# Patient Record
Sex: Female | Born: 1950 | Race: Black or African American | Hispanic: No
Health system: Southern US, Community
[De-identification: ages and names within clinical notes are randomized; demographics above are authoritative.]

## PROBLEM LIST (undated history)

## (undated) DIAGNOSIS — I10 Essential (primary) hypertension: Secondary | ICD-10-CM

## (undated) DIAGNOSIS — E119 Type 2 diabetes mellitus without complications: Secondary | ICD-10-CM

---

## 2019-08-28 ENCOUNTER — Emergency Department
Admission: EM | Admit: 2019-08-28 | Discharge: 2019-08-28 | Disposition: A | Discharge status: Home / Self Care | Payer: Medicare Other | Attending: Student in an Organized Health Care Education/Training Program | Admitting: Student in an Organized Health Care Education/Training Program

## 2019-08-28 ENCOUNTER — Emergency Department: Payer: Medicare Other

## 2019-08-28 ENCOUNTER — Other Ambulatory Visit: Payer: Self-pay

## 2019-08-28 DIAGNOSIS — W010XXA Fall on same level from slipping, tripping and stumbling without subsequent striking against object, initial encounter: Secondary | ICD-10-CM | POA: Diagnosis not present

## 2019-08-28 DIAGNOSIS — Y999 Unspecified external cause status: Secondary | ICD-10-CM | POA: Insufficient documentation

## 2019-08-28 DIAGNOSIS — I10 Essential (primary) hypertension: Secondary | ICD-10-CM | POA: Diagnosis not present

## 2019-08-28 DIAGNOSIS — W19XXXA Unspecified fall, initial encounter: Secondary | ICD-10-CM

## 2019-08-28 DIAGNOSIS — S0990XA Unspecified injury of head, initial encounter: Secondary | ICD-10-CM | POA: Diagnosis present

## 2019-08-28 DIAGNOSIS — E119 Type 2 diabetes mellitus without complications: Secondary | ICD-10-CM | POA: Insufficient documentation

## 2019-08-28 DIAGNOSIS — Y9301 Activity, walking, marching and hiking: Secondary | ICD-10-CM | POA: Diagnosis not present

## 2019-08-28 DIAGNOSIS — Y92512 Supermarket, store or market as the place of occurrence of the external cause: Secondary | ICD-10-CM | POA: Diagnosis not present

## 2019-08-28 HISTORY — DX: Essential (primary) hypertension: I10

## 2019-08-28 HISTORY — DX: Type 2 diabetes mellitus without complications: E11.9

## 2019-08-28 LAB — CBC WITH DIFFERENTIAL/PLATELET
Abs Immature Granulocytes: 0.03 10*3/uL (ref 0.00–0.07)
Basophils Absolute: 0 10*3/uL (ref 0.0–0.1)
Basophils Relative: 0 %
Eosinophils Absolute: 0.3 10*3/uL (ref 0.0–0.5)
Eosinophils Relative: 4 %
HCT: 33.5 % — ABNORMAL LOW (ref 36.0–46.0)
Hemoglobin: 10.4 g/dL — ABNORMAL LOW (ref 12.0–15.0)
Immature Granulocytes: 0 %
Lymphocytes Relative: 36 %
Lymphs Abs: 2.5 10*3/uL (ref 0.7–4.0)
MCH: 26.1 pg (ref 26.0–34.0)
MCHC: 31 g/dL (ref 30.0–36.0)
MCV: 84 fL (ref 80.0–100.0)
Monocytes Absolute: 0.6 10*3/uL (ref 0.1–1.0)
Monocytes Relative: 9 %
Neutro Abs: 3.4 10*3/uL (ref 1.7–7.7)
Neutrophils Relative %: 51 %
Platelets: 222 10*3/uL (ref 150–400)
RBC: 3.99 MIL/uL (ref 3.87–5.11)
RDW: 15.6 % — ABNORMAL HIGH (ref 11.5–15.5)
WBC: 6.8 10*3/uL (ref 4.0–10.5)
nRBC: 0 % (ref 0.0–0.2)

## 2019-08-28 LAB — COMPREHENSIVE METABOLIC PANEL
ALT: 22 U/L (ref 0–44)
AST: 16 U/L (ref 15–41)
Albumin: 3.7 g/dL (ref 3.5–5.0)
Alkaline Phosphatase: 107 U/L (ref 38–126)
Anion gap: 9 (ref 5–15)
BUN: 18 mg/dL (ref 8–23)
CO2: 28 mmol/L (ref 22–32)
Calcium: 8.7 mg/dL — ABNORMAL LOW (ref 8.9–10.3)
Chloride: 102 mmol/L (ref 98–111)
Creatinine, Ser: 0.93 mg/dL (ref 0.44–1.00)
GFR calc Af Amer: >60 mL/min (ref >60)
GFR calc non Af Amer: >60 mL/min (ref >60)
Glucose, Bld: 246 mg/dL — ABNORMAL HIGH (ref 70–99)
Potassium: 4.8 mmol/L (ref 3.5–5.1)
Sodium: 139 mmol/L (ref 135–145)
Total Bilirubin: 0.7 mg/dL (ref 0.3–1.2)
Total Protein: 6.7 g/dL (ref 6.5–8.1)

## 2019-08-28 MED ORDER — BUTALBITAL-APAP-CAFFEINE 50-325-40 MG PO TABS: 1.0000 | ORAL_TABLET | Freq: Four times a day (QID) | ORAL | 0 refills | Status: AC | PRN

## 2019-08-28 MED ORDER — ONDANSETRON HCL 4 MG/2ML IJ SOLN
4.0000 mg | Freq: Once | INTRAMUSCULAR | Status: AC
  Administered 2019-08-28: 4 mg via INTRAVENOUS
  Filled 2019-08-28: qty 2

## 2019-08-28 MED ORDER — SODIUM CHLORIDE 0.9 % IV BOLUS
500.0000 mL | Freq: Once | INTRAVENOUS | Status: AC
  Administered 2019-08-28: 500 mL via INTRAVENOUS

## 2019-08-28 MED ORDER — ACETAMINOPHEN 500 MG PO TABS
1000.0000 mg | ORAL_TABLET | Freq: Once | ORAL | Status: AC
  Administered 2019-08-28: 1000 mg via ORAL
  Filled 2019-08-28: qty 2

## 2019-08-28 MED ORDER — FENTANYL CITRATE (PF) 100 MCG/2ML IJ SOLN
50.0000 ug | INTRAMUSCULAR | Status: DC | PRN
  Administered 2019-08-28: 50 ug via INTRAVENOUS
  Filled 2019-08-28: qty 2

## 2019-08-28 NOTE — ED Triage Notes (Signed)
Pt arrives via [facility] from home after slipping and falling on wet paint in the [employer] parking lot. Per EMS pt did hit her head but unknown if she had LOC. Pt reports pain in the back of her neck and left side. Pt is A&Ox4 but is slow to answer questions. Per pt's daughter pt speaks softly when she is not feeling well. She reports no blurred vision, pt is tearful during triage

## 2019-08-28 NOTE — ED Notes (Signed)
Pt otf for imaging, daughter remains at bedside

## 2019-08-28 NOTE — ED Notes (Signed)
Pt ambulated in hallway with minimal assistance. Pt did require assistance with getting up from the bed but walked with steady but slow gait. Reports pain in the left side and rib area. Encouraged pt to drink fluids

## 2019-08-28 NOTE — Discharge Instructions (Signed)
Please follow up with PCP.   As discussed in the emergency department, you may use Tylenol and/or Ibuprofen for headaches. These are "Over the Counter" medications and can be found at most drug stores and grocery stores. Please use the recommended dosing instructions on the bottle/box. Do not exceed the maximum dose for either medications. Please be sure to rest and drink plenty of fluids. Please be sure to call your PCP for a follow-up visit, especially if your headaches persist.  Please call your physician or return to ED if you have: 1. Worsening or change in headaches. 2. Changes in vision. 3. New-onset nausea and vomiting. 4. Numbness, tingling, weakness in your extremities,. 4. Inability to eat or drink adequate amounts of food or liquids. 5. Chest pain, shortness of breath, or difficulty breathing. 6. Neurological changes- dizziness, fainting, loss of function of your arms, legs or other parts of your body. 7. Uncontrolled hypertension. 8. Or any other emergent concerns.

## 2019-08-28 NOTE — ED Provider Notes (Signed)
[facility] Emergency Department Provider Note    First MD Initiated Contact with Patient 08/28/19 1525     (approximate)  I have reviewed the triage vital signs and the nursing notes.   HISTORY  Chief Complaint Fall    HPI Yolanda Davies is a 68 y.o. female with the below listed history presents to the ER for evaluation of headache neck pain and low back pain after mechanical fall at [facility].  She slipped on white painted concrete in front of the [place] center following hitting her head.  There was LOC for bruit..  Patient complaining of mild to moderate headache.  Denies any numbness or tingling at this time.  Denies any anticoagulation.    Past Medical History:  Diagnosis Date  . Diabetes mellitus without complication ([place])   . Hypertension    History reviewed. No pertinent family history.  There are no active problems to display for this patient.     Prior to Admission medications   Not on File    Allergies Patient has no allergy information on record.    Social History Social History   Tobacco Use  . Smoking status: Not on file  Substance Use Topics  . Alcohol use: Not on file  . Drug use: Not on file    Review of Systems Patient denies headaches, rhinorrhea, blurry vision, numbness, shortness of breath, chest pain, edema, cough, abdominal pain, nausea, vomiting, diarrhea, dysuria, fevers, rashes or hallucinations unless otherwise stated above in HPI. ____________________________________________   PHYSICAL EXAM:  VITAL SIGNS: Vitals:   08/28/19 1516 08/28/19 1635  BP: (!) 177/100 (!) 170/93  Pulse: (!) 115 (!) 107  Resp: 19 18  Temp: 98.4 F (36.9 C)   SpO2: 99% 94%    Constitutional: Alert and oriented.  Eyes: Conjunctivae are normal.  Head: ttp in posterior scalp no laceration Nose: No congestion/rhinnorhea. Mouth/Throat: Mucous membranes are moist.   Neck: No stridor. ttp of midling neck without stepoffs or  deformities.  .  Cardiovascular: Normal rate, regular rhythm. Grossly normal heart sounds.  Good peripheral circulation. Respiratory: Normal respiratory effort.  No retractions. Lungs CTAB. Gastrointestinal: Soft and nontender. No distention. No abdominal bruits. No CVA tenderness. Genitourinary:  Musculoskeletal: No lower extremity tenderness nor edema.  No joint effusions. Neurologic:  Normal speech and language. No gross focal neurologic deficits are appreciated. No facial droop Skin:  Skin is warm, dry and intact. No rash noted. Psychiatric: Mood and affect are normal. Speech and behavior are normal.  ____________________________________________   LABS (all labs ordered are listed, but only abnormal results are displayed)  Results for orders placed or performed during the hospital encounter of 08/28/19 (from the past 24 hour(s))  CBC with Differential/Platelet     Status: Abnormal   Collection Time: 08/28/19  4:13 PM  Result Value Ref Range   WBC 6.8 4.0 - 10.5 K/uL   RBC 3.99 3.87 - 5.11 MIL/uL   Hemoglobin 10.4 (L) 12.0 - 15.0 g/dL   HCT 33.5 (L) 36.0 - 46.0 %   MCV 84.0 80.0 - 100.0 fL   MCH 26.1 26.0 - 34.0 pg   MCHC 31.0 30.0 - 36.0 g/dL   RDW 15.6 (H) 11.5 - 15.5 %   Platelets 222 150 - 400 K/uL   nRBC 0.0 0.0 - 0.2 %   Neutrophils Relative % 51 %   Neutro Abs 3.4 1.7 - 7.7 K/uL   Lymphocytes Relative 36 %   Lymphs Abs 2.5 0.7 -  4.0 K/uL   Monocytes Relative 9 %   Monocytes Absolute 0.6 0.1 - 1.0 K/uL   Eosinophils Relative 4 %   Eosinophils Absolute 0.3 0.0 - 0.5 K/uL   Basophils Relative 0 %   Basophils Absolute 0.0 0.0 - 0.1 K/uL   Immature Granulocytes 0 %   Abs Immature Granulocytes 0.03 0.00 - 0.07 K/uL  Comprehensive metabolic panel     Status: Abnormal   Collection Time: 08/28/19  4:13 PM  Result Value Ref Range   Sodium 139 135 - 145 mmol/L   Potassium 4.8 3.5 - 5.1 mmol/L   Chloride 102 98 - 111 mmol/L   CO2 28 22 - 32 mmol/L   Glucose, Bld 246 (H)  70 - 99 mg/dL   BUN 18 8 - 23 mg/dL   Creatinine, Ser 8.86 0.44 - 1.00 mg/dL   Calcium 8.7 (L) 8.9 - 10.3 mg/dL   Total Protein 6.7 6.5 - 8.1 g/dL   Albumin 3.7 3.5 - 5.0 g/dL   AST 16 15 - 41 U/L   ALT 22 0 - 44 U/L   Alkaline Phosphatase 107 38 - 126 U/L   Total Bilirubin 0.7 0.3 - 1.2 mg/dL   GFR calc non Af Amer >60 >60 mL/min   GFR calc Af Amer >60 >60 mL/min   Anion gap 9 5 - 15   ____________________________________________  EKG____________________________________________  RADIOLOGY  I personally reviewed all radiographic images ordered to evaluate for the above acute complaints and reviewed radiology reports and findings.  These findings were personally discussed with the patient.  Please see medical record for radiology report.  ____________________________________________   PROCEDURES  Procedure(s) performed:  Procedures    Critical Care performed: no ____________________________________________   INITIAL IMPRESSION / ASSESSMENT AND PLAN / ED COURSE  Pertinent labs & imaging results that were available during my care of the patient were reviewed by me and considered in my medical decision making (see chart for details).   DDX: Concussion, contusion, fracture, subdural, subarachnoid  Yolanda Davies is a 68 y.o. who presents to the ED with mechanical fall as described above.  CT imaging will be ordered for by differential.  Patient nontoxic-appearing mildly tachycardic but denies any abdominal pain or chest pain.  No shortness of breath.  Clinical Course as of Aug 27 1920  Mon Aug 28, 2019  1739 Imaging is reassuring.  Repeat exam stable.  Patient was able to tolerate PO and was able to ambulate with a steady gait.    [PR]    Clinical Course User Index [PR] Willy Eddy, MD    The patient was evaluated in Emergency Department today for the symptoms described in the history of present illness. He/she was evaluated in the context of the global COVID-19  pandemic, which necessitated consideration that the patient might be at risk for infection with the SARS-CoV-2 virus that causes COVID-19. Institutional protocols and algorithms that pertain to the evaluation of patients at risk for COVID-19 are in a state of rapid change based on information released by regulatory bodies including the CDC and federal and state organizations. These policies and algorithms were followed during the patient's care in the ED.  As part of my medical decision making, I reviewed the following data within the electronic MEDICAL RECORD NUMBER Nursing notes reviewed and incorporated, Labs reviewed, notes from prior ED visits and [place] Controlled Substance Database   ____________________________________________   FINAL CLINICAL IMPRESSION(S) / ED DIAGNOSES  Final diagnoses:  Fall, initial encounter  Minor head injury, initial encounter      NEW MEDICATIONS STARTED DURING THIS VISIT:  New Prescriptions   No medications on file     Note:  This document was prepared using Dragon voice recognition software and may include unintentional dictation errors.    Willy Eddyobinson, Refoel Palladino, MD 08/28/19 1921

## 2019-08-28 NOTE — ED Notes (Signed)
E-signature pad unavailable at this time, d/c paperwork given to pt and explained, pt voices understanding of d/c teaching

## 2020-02-15 IMAGING — CT CT CERVICAL SPINE W/O CM
3 of 7 series · 10 of 33 positions shown, 11 images · non-contrast
Comparison: None.

CLINICAL DATA: 68-year-old female with fall.

EXAM:
CT HEAD WITHOUT CONTRAST
CT CERVICAL SPINE WITHOUT CONTRAST
TECHNIQUE: Multidetector CT imaging of the head and cervical spine was
performed following the standard protocol without intravenous
contrast. Multiplanar CT image reconstructions of the cervical spine
were also generated.

[Series 8: coronal soft tissue · coronal · 0.28mm/px · 3 of 64 slices shown]
[im 16/64  bone]
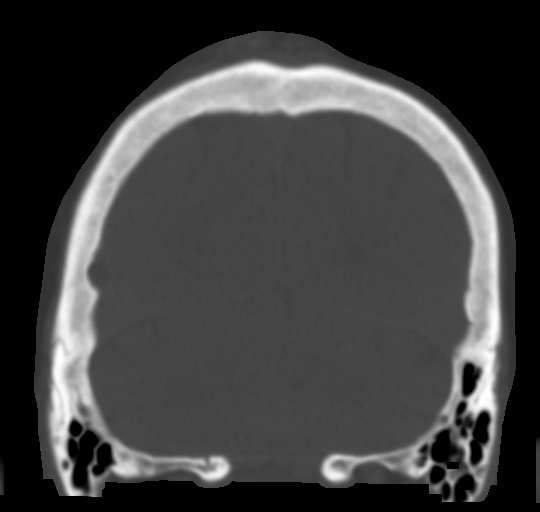
[im 32/64  bone]
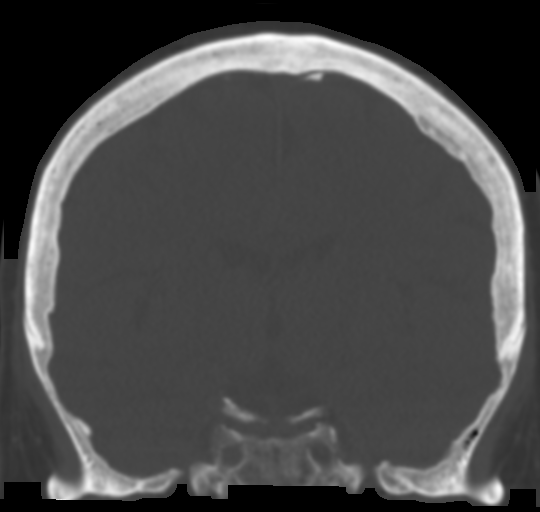
[im 48/64  bone]
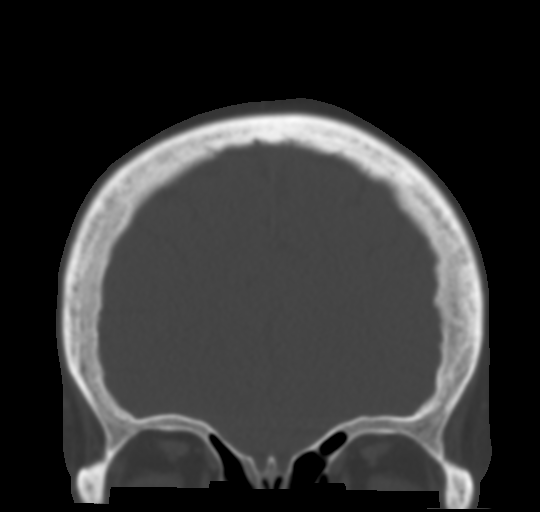

[Series 10: sagittal bone · sagittal · 0.20mm/px · 5 of 73 slices shown]
[im 11/73  bone]
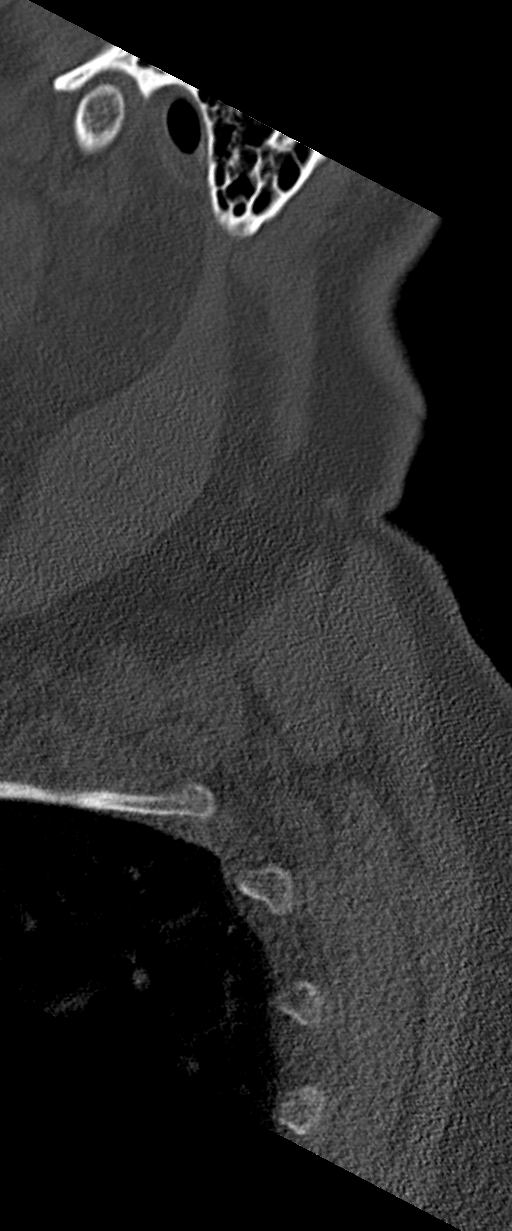
[im 21/73  bone]
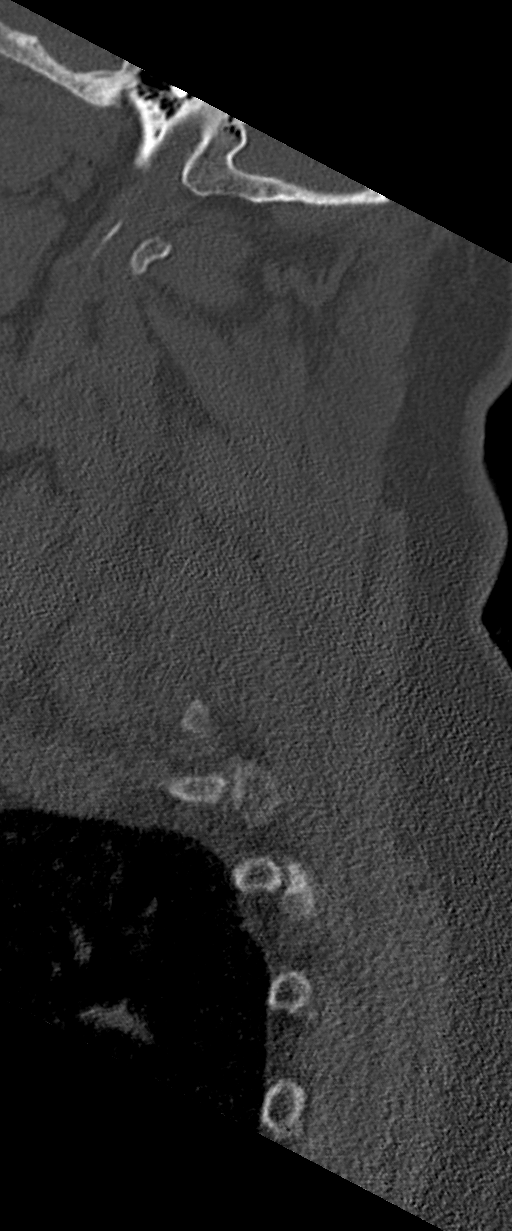
[im 31/73  bone]
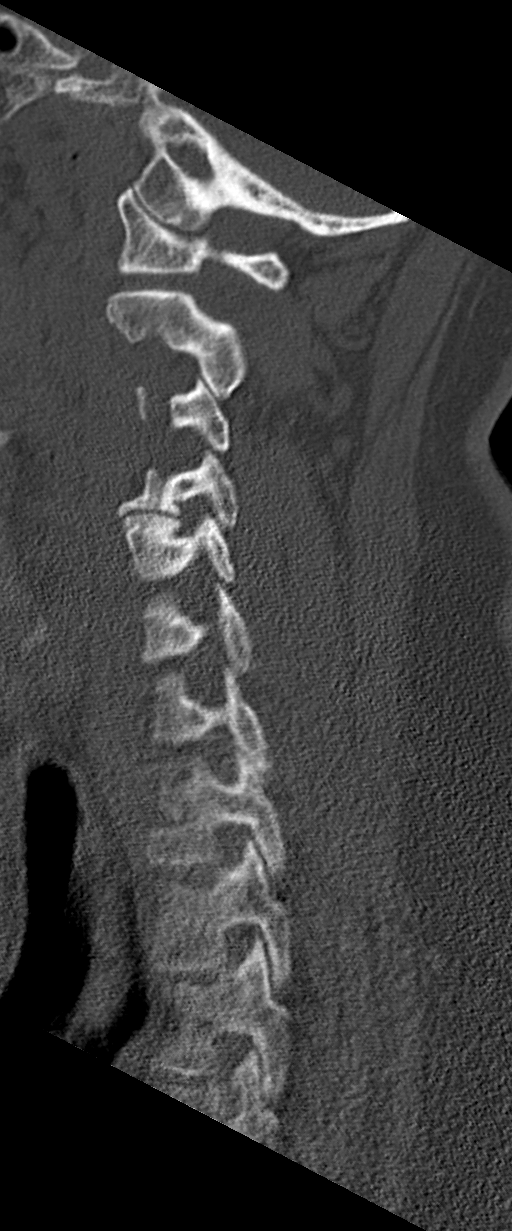
[im 42/73  bone]
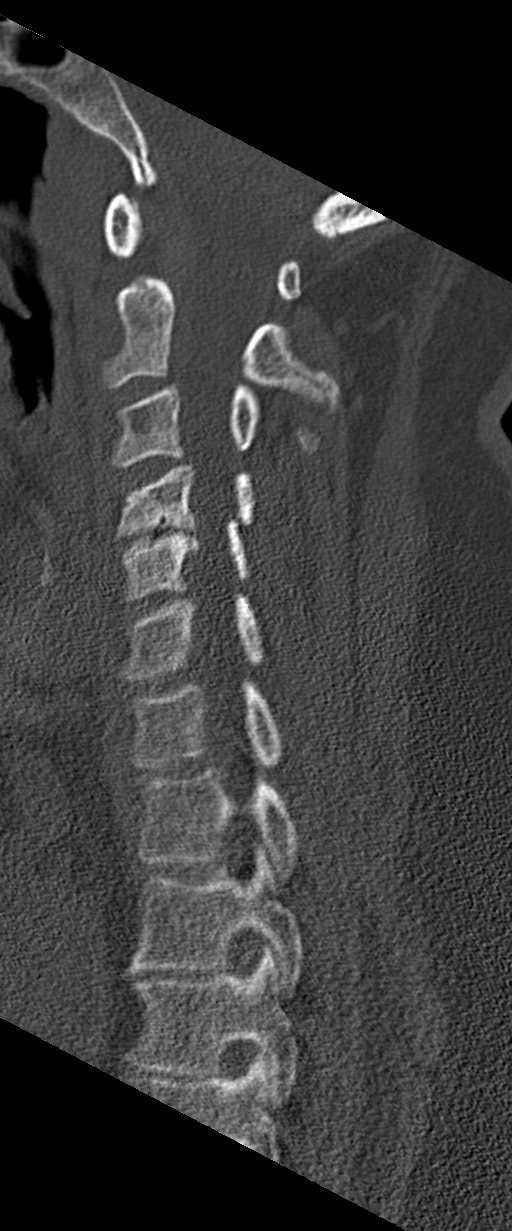
[im 52/73  bone]
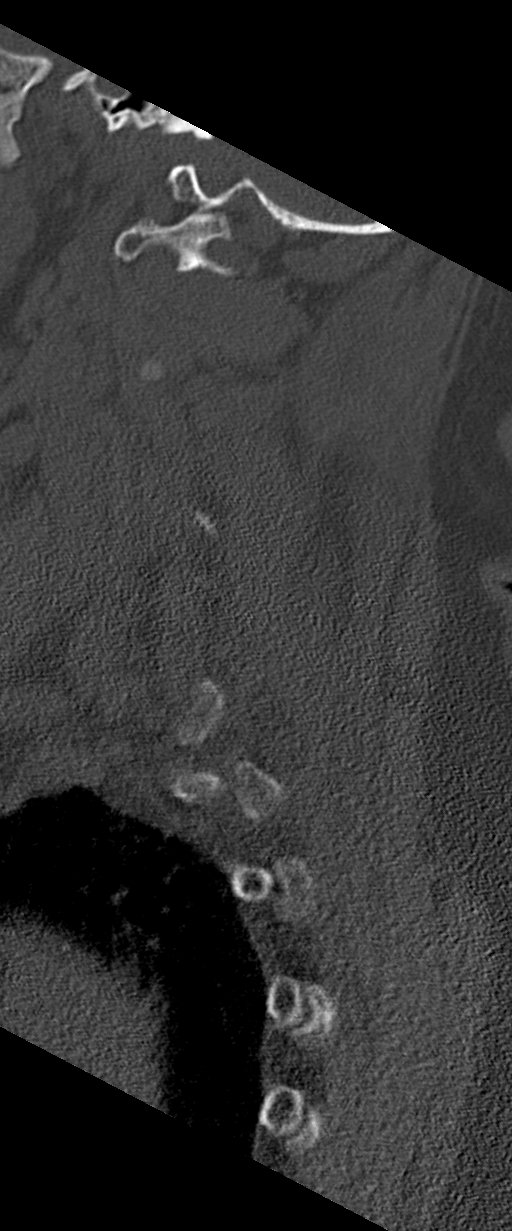

[Series 12: orthogonal bone · axial · 0.20mm/px · z∈[-300,-226]mm · 2 of 126 slices shown, 3 images]
[im 42/126  soft-tissue]
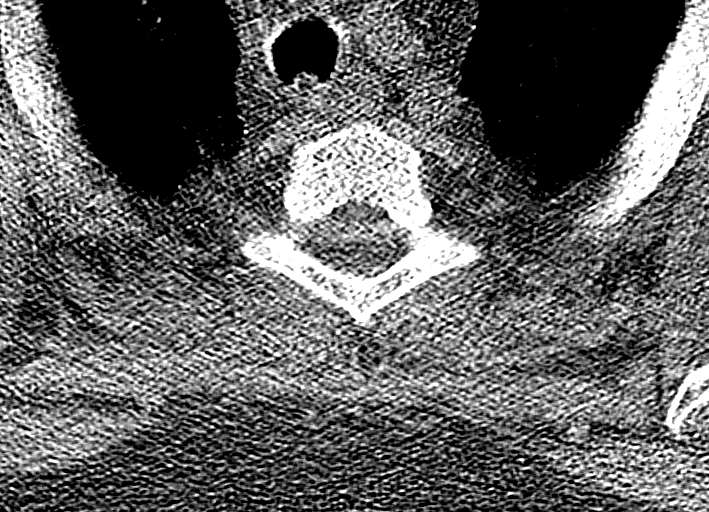
[im 42/126  bone]
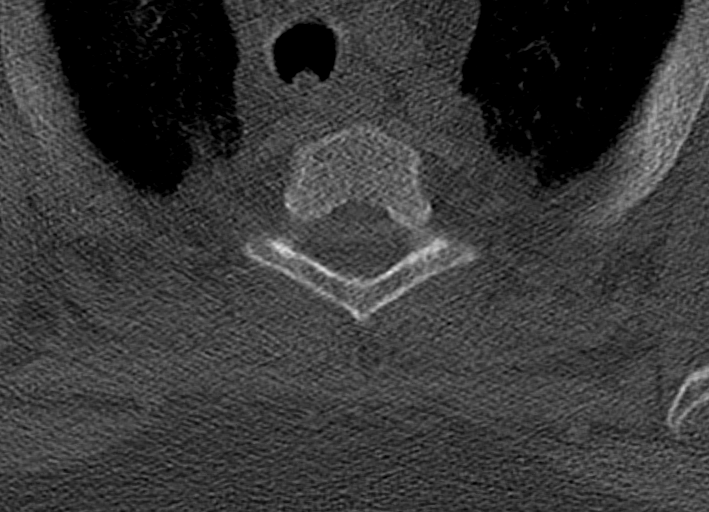
[im 84/126  bone]
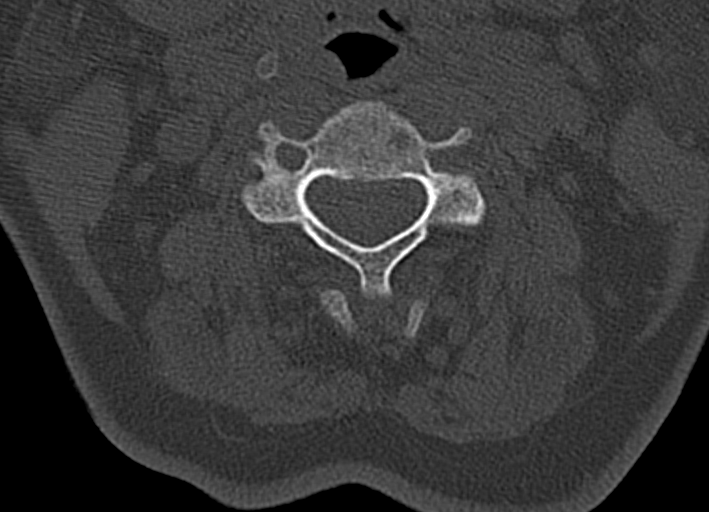

[10 of 33 positions shown; findings below may reference images not displayed]

FINDINGS: CT HEAD FINDINGS

Brain: The ventricles and sulci appropriate size for patient's age.
The gray-white matter discrimination is preserved. There is no acute
intracranial hemorrhage. No mass effect or midline shift. No
extra-axial fluid collection.

Vascular: No hyperdense vessel or unexpected calcification.

Skull: Normal. Negative for fracture or focal lesion.

Sinuses/Orbits: No acute finding.

Other: None

CT CERVICAL SPINE FINDINGS

Alignment: No acute subluxation. There is straightening of normal
cervical lordosis which may be positional or due to muscle spasm.

Skull base and vertebrae: No acute fracture

Soft tissues and spinal canal: No prevertebral fluid or swelling. No
visible canal hematoma.

Disc levels: Degenerative changes primarily at C4-C5 with this paste
narrowing and endplate irregularity.

Upper chest: Negative.

Other: None
IMPRESSION: 1. No acute intracranial pathology.
2. No acute/traumatic cervical spine pathology.
